# Patient Record
Sex: Female | Born: 1970
Health system: Southern US, Community
[De-identification: ages and names within clinical notes are randomized; demographics above are authoritative.]

---

## 1999-03-30 ENCOUNTER — Other Ambulatory Visit: Admission: RE | Admit: 1999-03-30 | Discharge: 1999-03-30 | Payer: Self-pay | Admitting: Family Medicine

## 2000-02-01 ENCOUNTER — Inpatient Hospital Stay (HOSPITAL_COMMUNITY): Admission: AD | Admit: 2000-02-01 | Discharge: 2000-02-04 | Payer: Self-pay | Admitting: Obstetrics and Gynecology

## 2000-03-20 ENCOUNTER — Other Ambulatory Visit: Admission: RE | Admit: 2000-03-20 | Discharge: 2000-03-20 | Payer: Self-pay | Admitting: Obstetrics and Gynecology

## 2000-10-02 ENCOUNTER — Encounter: Payer: Self-pay | Admitting: Endocrinology

## 2000-10-02 ENCOUNTER — Ambulatory Visit (HOSPITAL_COMMUNITY): Admission: RE | Admit: 2000-10-02 | Discharge: 2000-10-02 | Payer: Self-pay | Admitting: Endocrinology

## 2001-05-17 ENCOUNTER — Other Ambulatory Visit: Admission: RE | Admit: 2001-05-17 | Discharge: 2001-05-17 | Payer: Self-pay | Admitting: Obstetrics and Gynecology

## 2002-04-03 ENCOUNTER — Inpatient Hospital Stay (HOSPITAL_COMMUNITY): Admission: AD | Admit: 2002-04-03 | Discharge: 2002-04-05 | Payer: Self-pay | Admitting: Obstetrics and Gynecology

## 2002-05-19 ENCOUNTER — Other Ambulatory Visit: Admission: RE | Admit: 2002-05-19 | Discharge: 2002-05-19 | Payer: Self-pay | Admitting: Obstetrics and Gynecology

## 2002-06-16 ENCOUNTER — Encounter: Payer: Self-pay | Admitting: Obstetrics and Gynecology

## 2002-06-16 ENCOUNTER — Encounter: Admission: RE | Admit: 2002-06-16 | Discharge: 2002-06-16 | Payer: Self-pay | Admitting: Obstetrics and Gynecology

## 2002-12-03 ENCOUNTER — Encounter: Payer: Self-pay | Admitting: Obstetrics and Gynecology

## 2002-12-03 ENCOUNTER — Encounter: Admission: RE | Admit: 2002-12-03 | Discharge: 2002-12-03 | Payer: Self-pay | Admitting: Obstetrics and Gynecology

## 2017-05-18 ENCOUNTER — Other Ambulatory Visit (HOSPITAL_COMMUNITY)
Admission: RE | Admit: 2017-05-18 | Discharge: 2017-05-18 | Disposition: A | Discharge status: Home / Self Care | Payer: Commercial Managed Care - PPO | Source: Ambulatory Visit | Attending: Family Medicine | Admitting: Family Medicine

## 2017-05-18 ENCOUNTER — Other Ambulatory Visit: Payer: Self-pay | Admitting: Family Medicine

## 2017-05-18 DIAGNOSIS — Z01419 Encounter for gynecological examination (general) (routine) without abnormal findings: Secondary | ICD-10-CM | POA: Diagnosis not present

## 2017-05-18 DIAGNOSIS — Z124 Encounter for screening for malignant neoplasm of cervix: Secondary | ICD-10-CM | POA: Diagnosis not present

## 2017-05-18 DIAGNOSIS — Z0000 Encounter for general adult medical examination without abnormal findings: Secondary | ICD-10-CM | POA: Diagnosis not present

## 2017-05-18 DIAGNOSIS — Z136 Encounter for screening for cardiovascular disorders: Secondary | ICD-10-CM | POA: Diagnosis not present

## 2017-05-21 LAB — CYTOLOGY - PAP: Diagnosis: NEGATIVE

## 2017-09-13 ENCOUNTER — Other Ambulatory Visit: Payer: Self-pay | Admitting: Family Medicine

## 2017-09-13 DIAGNOSIS — Z1231 Encounter for screening mammogram for malignant neoplasm of breast: Secondary | ICD-10-CM

## 2017-10-03 ENCOUNTER — Ambulatory Visit
Admission: RE | Admit: 2017-10-03 | Discharge: 2017-10-03 | Disposition: A | Discharge status: Home / Self Care | Payer: Commercial Managed Care - PPO | Source: Ambulatory Visit | Attending: Family Medicine | Admitting: Family Medicine

## 2017-10-03 DIAGNOSIS — Z1231 Encounter for screening mammogram for malignant neoplasm of breast: Secondary | ICD-10-CM | POA: Diagnosis not present

## 2017-10-31 DIAGNOSIS — R0981 Nasal congestion: Secondary | ICD-10-CM | POA: Diagnosis not present

## 2019-09-13 ENCOUNTER — Ambulatory Visit: Payer: Commercial Managed Care - PPO | Attending: Internal Medicine

## 2019-09-13 DIAGNOSIS — Z23 Encounter for immunization: Secondary | ICD-10-CM

## 2019-09-13 NOTE — Progress Notes (Signed)
   Covid-19 Vaccination Clinic  Name:  Debra Hines    MRN: [id number] DOB: 02-25-1971  09/13/2019  [name] was observed post Covid-19 immunization for 15 minutes without incidence. She was provided with Vaccine Information Sheet and instruction to access the V-Safe system.   [name] was instructed to call 911 with any severe reactions post vaccine: Marland Kitchen Difficulty breathing  . Swelling of your face and throat  . A fast heartbeat  . A bad rash all over your body  . Dizziness and weakness    Immunizations Administered    Name Date Dose VIS Date Route   Pfizer COVID-19 Vaccine 09/13/2019  9:44 AM 0.3 mL 06/27/2019 Intramuscular   Manufacturer: [employer], [employer]   Lot: [id number]   NDC: [address]-0

## 2019-10-04 ENCOUNTER — Ambulatory Visit: Payer: Commercial Managed Care - PPO | Attending: Internal Medicine

## 2019-10-04 DIAGNOSIS — Z23 Encounter for immunization: Secondary | ICD-10-CM

## 2019-10-04 NOTE — Progress Notes (Signed)
   Covid-19 Vaccination Clinic  Name:  ESTREYA CLAY    MRN: [id number] DOB: [birth year]  10/04/2019  Ms. Ritter was observed post Covid-19 immunization for 15 minutes without incident. She was provided with Vaccine Information Sheet and instruction to access the V-Safe system.   Ms. Corliss was instructed to call 911 with any severe reactions post vaccine: Marland Kitchen Difficulty breathing  . Swelling of face and throat  . A fast heartbeat  . A bad rash all over body  . Dizziness and weakness   Immunizations Administered    Name Date Dose VIS Date Route   Pfizer COVID-19 Vaccine 10/04/2019 11:45 AM 0.3 mL 06/27/2019 Intramuscular   Manufacturer: [employer], [employer]   Lot: [id number]   NDC: [address]-1

## 2019-10-08 ENCOUNTER — Ambulatory Visit: Payer: Commercial Managed Care - PPO

## 2019-12-30 ENCOUNTER — Other Ambulatory Visit (HOSPITAL_COMMUNITY)
Admission: RE | Admit: 2019-12-30 | Discharge: 2019-12-30 | Disposition: A | Discharge status: Home / Self Care | Payer: Commercial Managed Care - PPO | Source: Ambulatory Visit | Attending: Family Medicine | Admitting: Family Medicine

## 2019-12-30 DIAGNOSIS — Z124 Encounter for screening for malignant neoplasm of cervix: Secondary | ICD-10-CM | POA: Insufficient documentation

## 2020-01-01 LAB — CYTOLOGY - PAP
Adequacy: ABSENT
Diagnosis: NEGATIVE

## 2020-01-05 ENCOUNTER — Other Ambulatory Visit: Payer: Self-pay | Admitting: Family Medicine

## 2020-01-05 DIAGNOSIS — Z1231 Encounter for screening mammogram for malignant neoplasm of breast: Secondary | ICD-10-CM

## 2020-01-09 ENCOUNTER — Ambulatory Visit
Admission: RE | Admit: 2020-01-09 | Discharge: 2020-01-09 | Disposition: A | Discharge status: Home / Self Care | Payer: Commercial Managed Care - PPO | Source: Ambulatory Visit | Attending: Family Medicine | Admitting: Family Medicine

## 2020-01-09 ENCOUNTER — Other Ambulatory Visit: Payer: Self-pay

## 2020-01-09 DIAGNOSIS — Z1231 Encounter for screening mammogram for malignant neoplasm of breast: Secondary | ICD-10-CM

## 2020-12-30 IMAGING — MG DIGITAL SCREENING BILAT W/ TOMO W/ CAD
8 series · 9 of 24 positions shown · non-contrast
Comparison: Previous exam(s).

CLINICAL DATA: Screening.

EXAM:
DIGITAL SCREENING BILATERAL MAMMOGRAM WITH TOMO AND CAD

[L CC synth-2D]
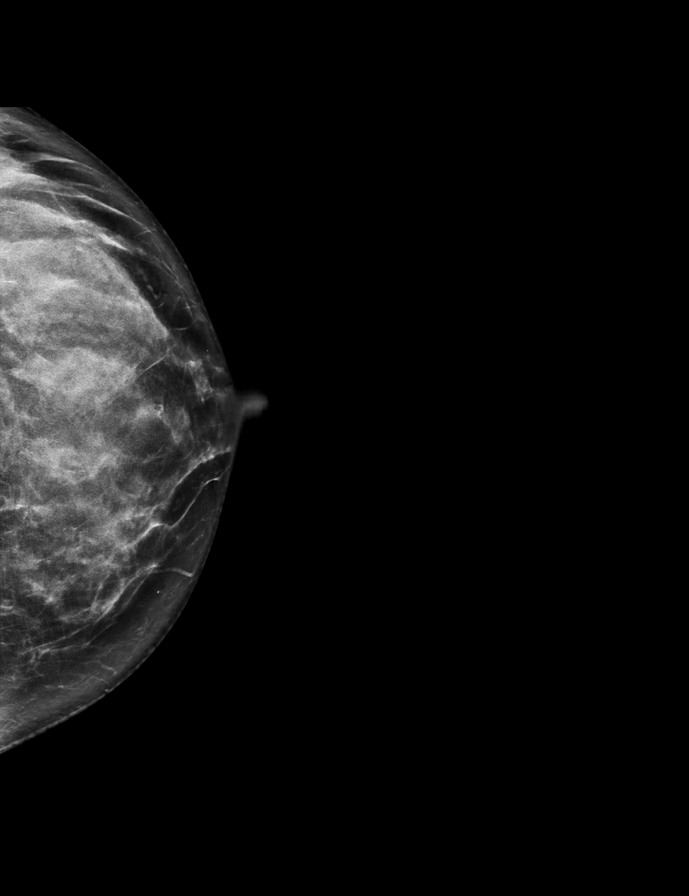

[L MLO synth-2D]
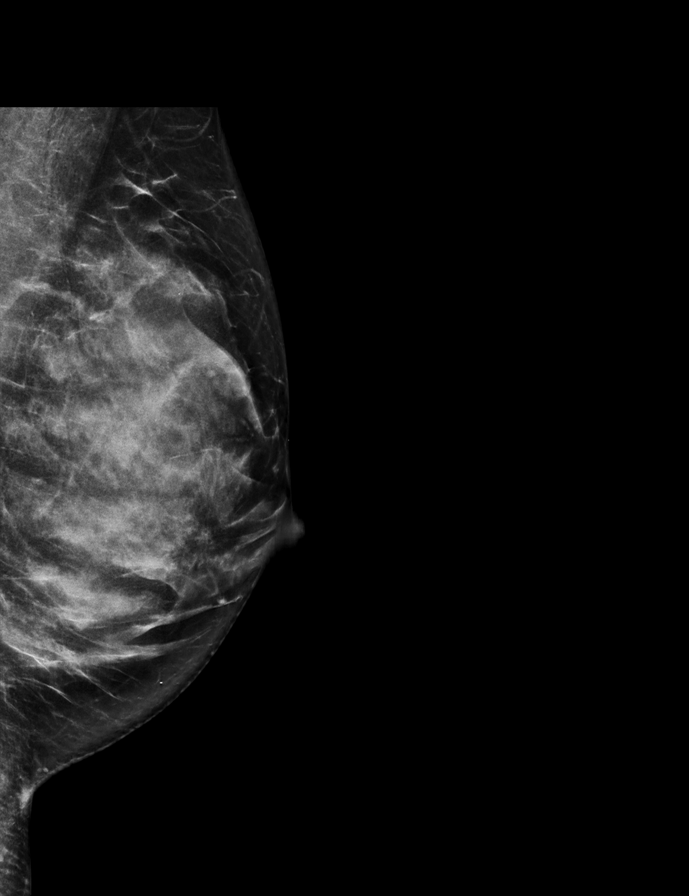

[R MLO synth-2D]
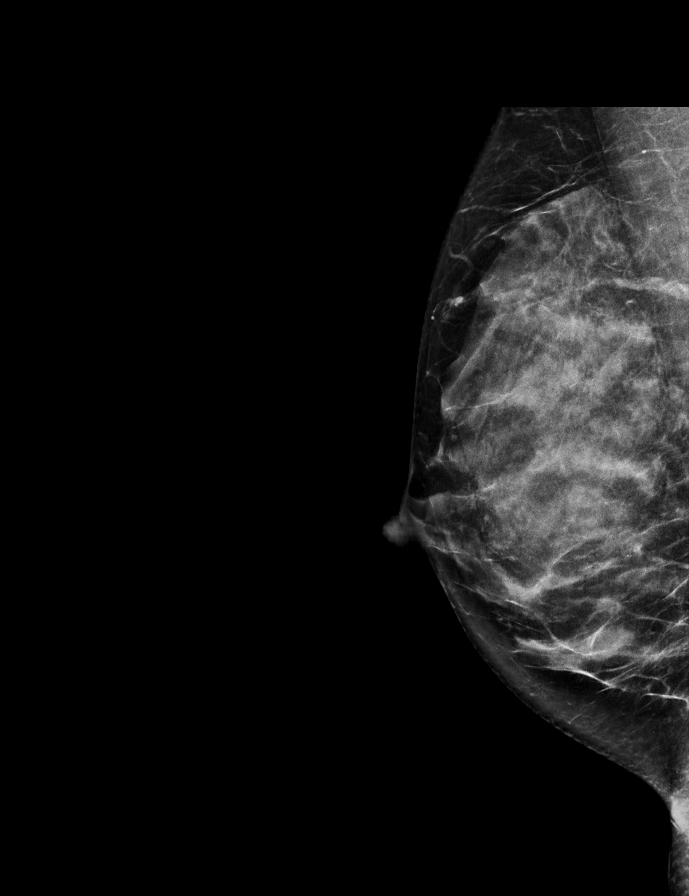

[R CC synth-2D]
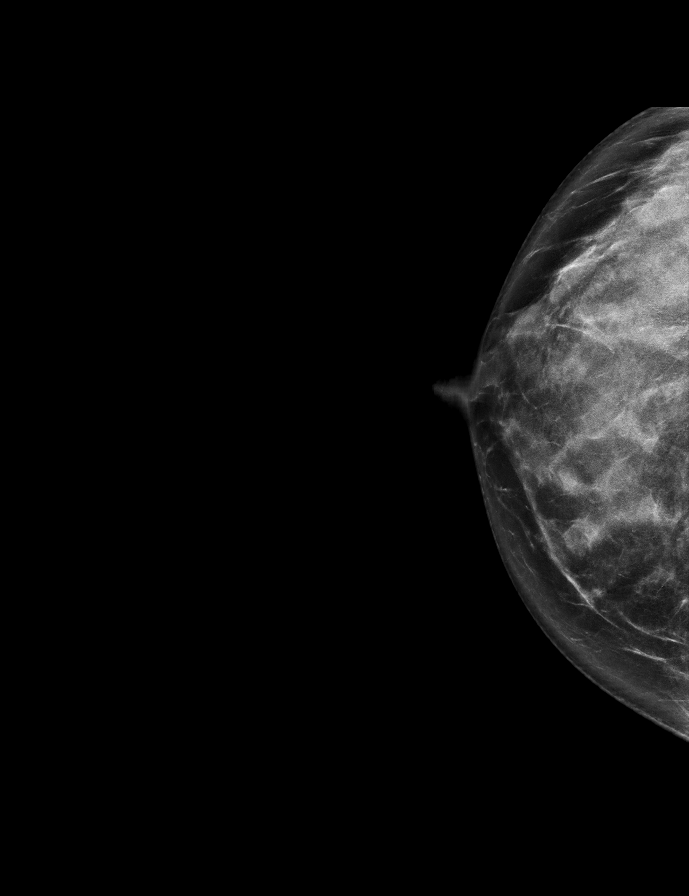

[L CC tomo · 2 of 63 frames shown]
[frame 21/63]
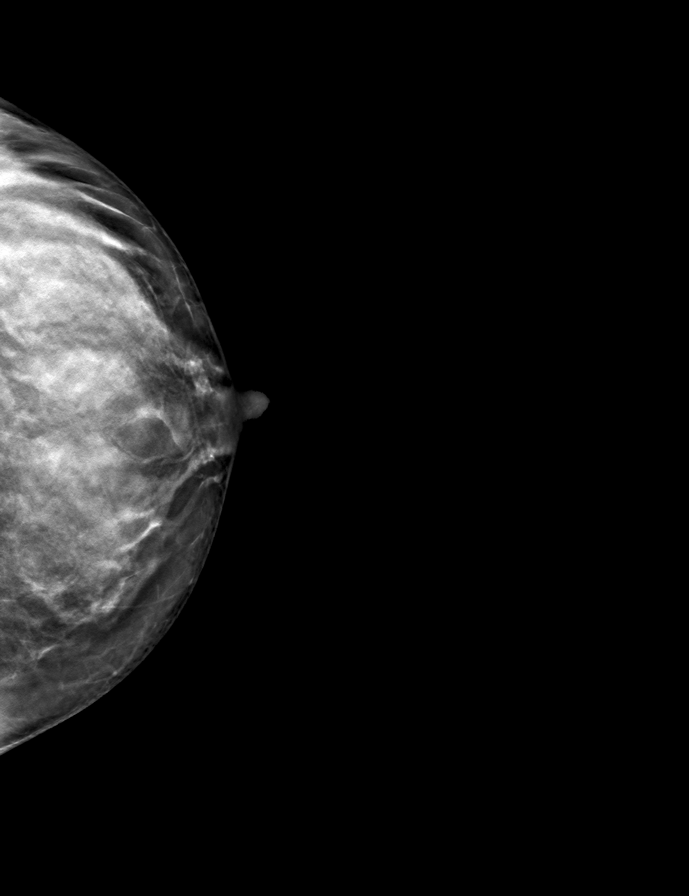
[frame 32/63]
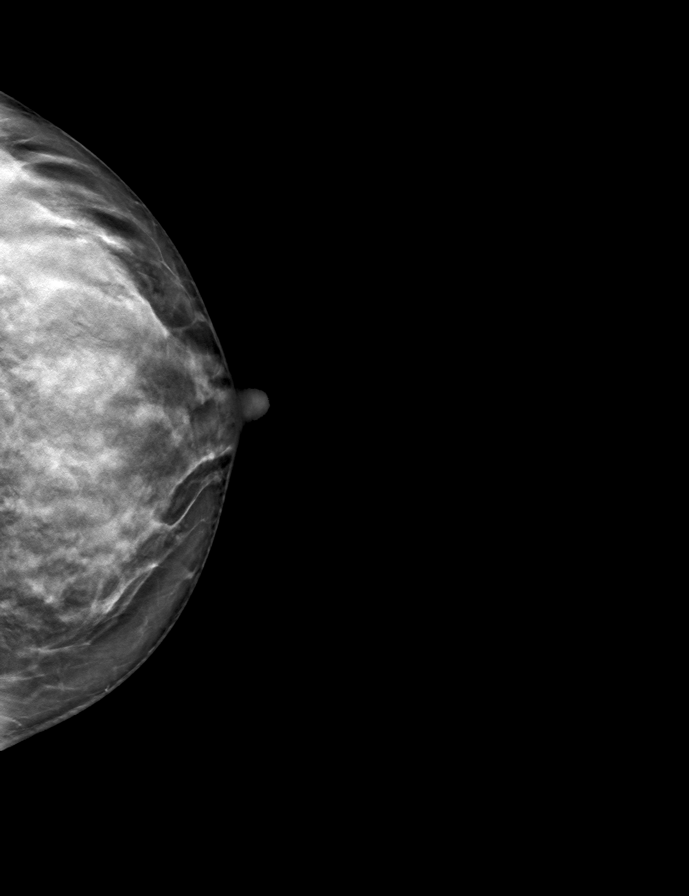

[R CC tomo · tomo slice 33/64.0]
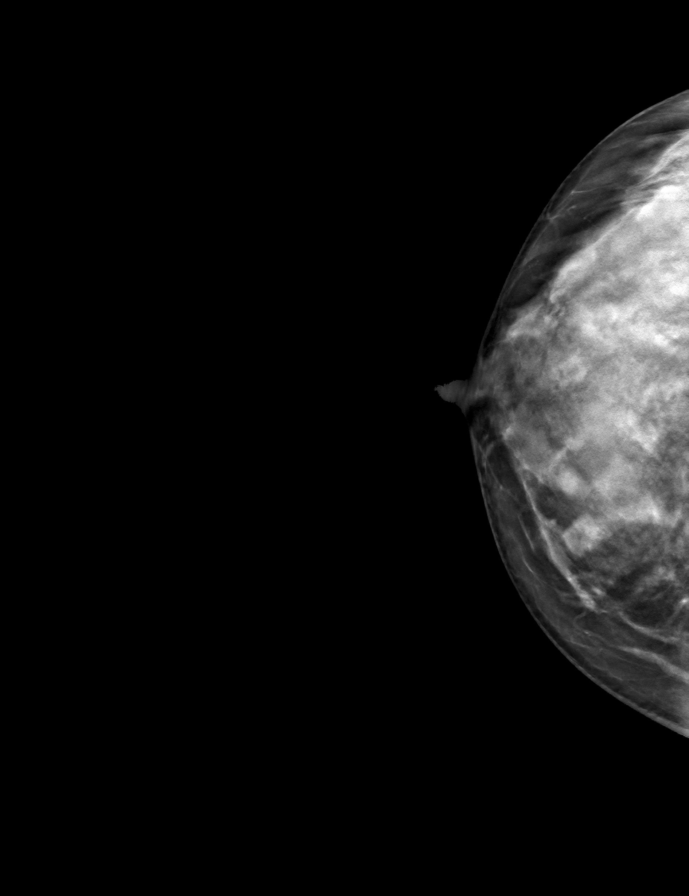

[L MLO tomo · tomo slice 32/63.0]
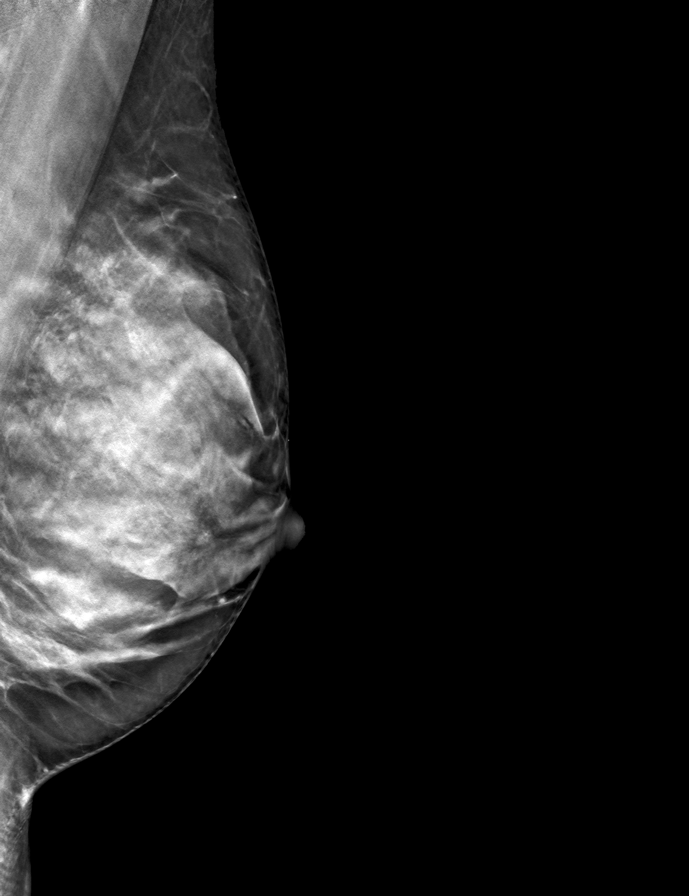

[R MLO tomo · tomo slice 31/61.0]
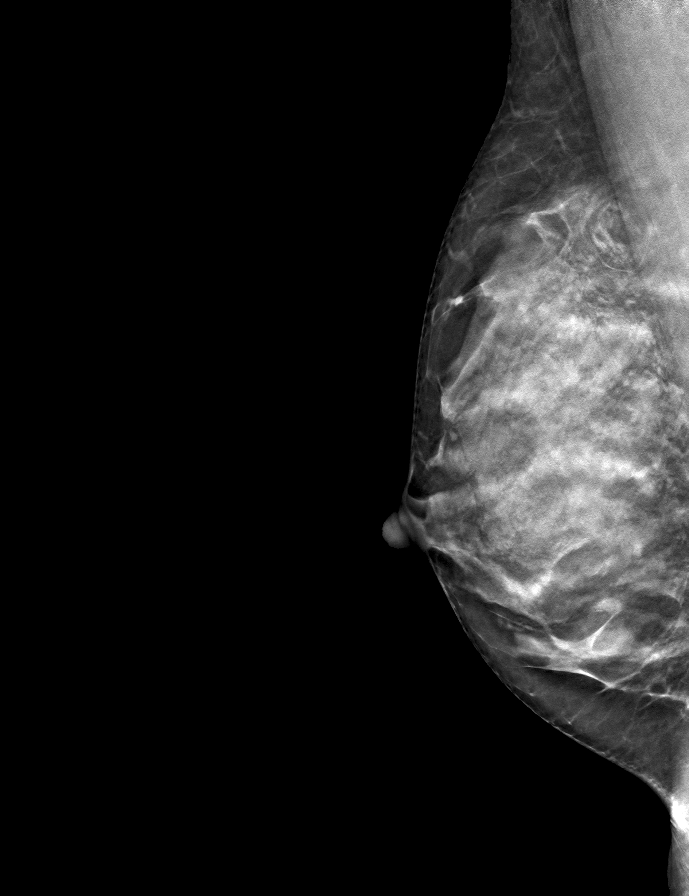

[9 of 24 positions shown; findings below may reference images not displayed]

ACR Breast Density Category d: The breast tissue is extremely dense,
which lowers the sensitivity of mammography
FINDINGS: There are no findings suspicious for malignancy. Images were
processed with CAD.
IMPRESSION: No mammographic evidence of malignancy. A result letter of this
screening mammogram will be mailed directly to the patient.

RECOMMENDATION:
Screening mammogram in one year. (Code:[id number])

BI-RADS CATEGORY  1: Negative.

## 2021-02-15 ENCOUNTER — Other Ambulatory Visit: Payer: Self-pay | Admitting: Family Medicine

## 2021-02-15 ENCOUNTER — Ambulatory Visit
Admission: RE | Admit: 2021-02-15 | Discharge: 2021-02-15 | Disposition: A | Discharge status: Home / Self Care | Payer: Commercial Managed Care - PPO | Source: Ambulatory Visit | Attending: Family Medicine | Admitting: Family Medicine

## 2021-02-15 ENCOUNTER — Other Ambulatory Visit: Payer: Self-pay

## 2021-02-15 DIAGNOSIS — Z1231 Encounter for screening mammogram for malignant neoplasm of breast: Secondary | ICD-10-CM

## 2022-02-02 ENCOUNTER — Other Ambulatory Visit: Payer: Self-pay | Admitting: Family Medicine

## 2022-02-02 DIAGNOSIS — Z1231 Encounter for screening mammogram for malignant neoplasm of breast: Secondary | ICD-10-CM

## 2022-02-16 ENCOUNTER — Ambulatory Visit
Admission: RE | Admit: 2022-02-16 | Discharge: 2022-02-16 | Disposition: A | Discharge status: Home / Self Care | Payer: Commercial Managed Care - PPO | Source: Ambulatory Visit | Attending: Family Medicine | Admitting: Family Medicine

## 2022-02-16 DIAGNOSIS — Z1231 Encounter for screening mammogram for malignant neoplasm of breast: Secondary | ICD-10-CM

## 2024-01-02 ENCOUNTER — Other Ambulatory Visit: Payer: Self-pay | Admitting: Obstetrics and Gynecology

## 2024-01-02 DIAGNOSIS — Z1231 Encounter for screening mammogram for malignant neoplasm of breast: Secondary | ICD-10-CM

## 2024-02-21 ENCOUNTER — Other Ambulatory Visit (HOSPITAL_BASED_OUTPATIENT_CLINIC_OR_DEPARTMENT_OTHER): Payer: Self-pay | Admitting: Internal Medicine

## 2024-02-21 DIAGNOSIS — Z136 Encounter for screening for cardiovascular disorders: Secondary | ICD-10-CM

## 2024-02-26 ENCOUNTER — Ambulatory Visit
Admission: RE | Admit: 2024-02-26 | Discharge: 2024-02-26 | Disposition: A | Discharge status: Home / Self Care | Source: Ambulatory Visit | Attending: Obstetrics and Gynecology | Admitting: Obstetrics and Gynecology

## 2024-02-26 DIAGNOSIS — Z1231 Encounter for screening mammogram for malignant neoplasm of breast: Secondary | ICD-10-CM

## 2024-03-24 ENCOUNTER — Ambulatory Visit (HOSPITAL_BASED_OUTPATIENT_CLINIC_OR_DEPARTMENT_OTHER)
Admission: RE | Admit: 2024-03-24 | Discharge: 2024-03-24 | Disposition: A | Discharge status: Home / Self Care | Payer: Self-pay | Source: Ambulatory Visit | Attending: Internal Medicine | Admitting: Internal Medicine

## 2024-03-24 DIAGNOSIS — Z136 Encounter for screening for cardiovascular disorders: Secondary | ICD-10-CM | POA: Insufficient documentation
